# Patient Record
Sex: Female | Born: 1995 | Race: White | Hispanic: No | Marital: Single | Smoking: Never smoker
Health system: Southern US, Community
[De-identification: ages and names within clinical notes are randomized; demographics above are authoritative.]

## PROBLEM LIST (undated history)

## (undated) DIAGNOSIS — F4310 Post-traumatic stress disorder, unspecified: Secondary | ICD-10-CM

## (undated) DIAGNOSIS — F32A Depression, unspecified: Secondary | ICD-10-CM

## (undated) DIAGNOSIS — J40 Bronchitis, not specified as acute or chronic: Secondary | ICD-10-CM

## (undated) DIAGNOSIS — F419 Anxiety disorder, unspecified: Secondary | ICD-10-CM

## (undated) DIAGNOSIS — E282 Polycystic ovarian syndrome: Secondary | ICD-10-CM

## (undated) DIAGNOSIS — F329 Major depressive disorder, single episode, unspecified: Secondary | ICD-10-CM

---

## 2013-09-25 ENCOUNTER — Emergency Department (HOSPITAL_COMMUNITY)
Admission: EM | Admit: 2013-09-25 | Discharge: 2013-09-25 | Disposition: A | Discharge status: Home / Self Care | Payer: BC Managed Care – PPO | Attending: Emergency Medicine | Admitting: Emergency Medicine

## 2013-09-25 ENCOUNTER — Encounter (HOSPITAL_COMMUNITY): Payer: Self-pay | Admitting: Emergency Medicine

## 2013-09-25 ENCOUNTER — Emergency Department (HOSPITAL_COMMUNITY): Payer: BC Managed Care – PPO

## 2013-09-25 DIAGNOSIS — R197 Diarrhea, unspecified: Secondary | ICD-10-CM | POA: Insufficient documentation

## 2013-09-25 DIAGNOSIS — R059 Cough, unspecified: Secondary | ICD-10-CM

## 2013-09-25 DIAGNOSIS — F411 Generalized anxiety disorder: Secondary | ICD-10-CM | POA: Insufficient documentation

## 2013-09-25 DIAGNOSIS — F329 Major depressive disorder, single episode, unspecified: Secondary | ICD-10-CM | POA: Insufficient documentation

## 2013-09-25 DIAGNOSIS — F3289 Other specified depressive episodes: Secondary | ICD-10-CM | POA: Insufficient documentation

## 2013-09-25 DIAGNOSIS — F4310 Post-traumatic stress disorder, unspecified: Secondary | ICD-10-CM | POA: Insufficient documentation

## 2013-09-25 DIAGNOSIS — J069 Acute upper respiratory infection, unspecified: Secondary | ICD-10-CM | POA: Insufficient documentation

## 2013-09-25 DIAGNOSIS — R05 Cough: Secondary | ICD-10-CM

## 2013-09-25 DIAGNOSIS — Z8639 Personal history of other endocrine, nutritional and metabolic disease: Secondary | ICD-10-CM | POA: Insufficient documentation

## 2013-09-25 DIAGNOSIS — Z862 Personal history of diseases of the blood and blood-forming organs and certain disorders involving the immune mechanism: Secondary | ICD-10-CM | POA: Insufficient documentation

## 2013-09-25 DIAGNOSIS — R1110 Vomiting, unspecified: Secondary | ICD-10-CM | POA: Insufficient documentation

## 2013-09-25 DIAGNOSIS — Z79899 Other long term (current) drug therapy: Secondary | ICD-10-CM | POA: Insufficient documentation

## 2013-09-25 HISTORY — DX: Anxiety disorder, unspecified: F41.9

## 2013-09-25 HISTORY — DX: Post-traumatic stress disorder, unspecified: F43.10

## 2013-09-25 HISTORY — DX: Polycystic ovarian syndrome: E28.2

## 2013-09-25 HISTORY — DX: Bronchitis, not specified as acute or chronic: J40

## 2013-09-25 HISTORY — DX: Major depressive disorder, single episode, unspecified: F32.9

## 2013-09-25 HISTORY — DX: Depression, unspecified: F32.A

## 2013-09-25 MED ORDER — SALINE SPRAY 0.65 % NA SOLN: 1.0000 | NASAL | Status: DC | PRN

## 2013-09-25 MED ORDER — GUAIFENESIN 100 MG/5ML PO LIQD: 100.0000 mg | ORAL | Status: DC | PRN

## 2013-09-25 MED ORDER — BENZONATATE 100 MG PO CAPS: 100.0000 mg | ORAL_CAPSULE | Freq: Three times a day (TID) | ORAL | Status: DC

## 2013-09-25 NOTE — ED Provider Notes (Signed)
CSN: [id number]     Arrival date & time 09/25/13  2152 History  This chart was scribed for Junius FinnerErin O'Malley, PA working with Merrie RoofJohn David Wofford III, MD by Quintella ReichertMatthew Underwood, ED Scribe. This patient was seen in room WTR7/WTR7 and the patient's care was started at 11:22 PM.   Chief Complaint  Patient presents with  . Cough    The history is provided by the patient. No language interpreter was used.    HPI Comments: Amber Hurley is a 18 y.o. female who presents to the Emergency Department complaining of 3 days of persistent cough with associated congestion.  Pt states that today her cough worsened to the point that she has developed associated back pain, headache, and mild SOB.  She also had some post-tussive emesis today.  She denies any other emesis.  She does note 2 episodes of diarrhea today.  She denies fever.  She has taken OTC medication including Tylenol and Motrin, without relief.  Pt admits to recent sick contact with her mother who had similar symptoms.  She denies h/o asthma.  She denies medication allergies.   Past Medical History  Diagnosis Date  . Bronchitis   . Depression   . Anxiety   . PCOS (polycystic ovarian syndrome)   . PTSD (post-traumatic stress disorder)     History reviewed. No pertinent past surgical history.  No family history on file.   History  Substance Use Topics  . Smoking status: Never Smoker   . Smokeless tobacco: Not on file  . Alcohol Use: No    OB History   Grav Para Term Preterm Abortions TAB SAB Ect Mult Living                   Review of Systems  Constitutional: Negative for fever.  HENT: Positive for congestion.   Respiratory: Positive for cough and shortness of breath.   Gastrointestinal: Positive for vomiting (post-tussive) and diarrhea.  Musculoskeletal: Positive for back pain.  Neurological: Positive for headaches.  All other systems reviewed and are negative.      Allergies  Review of patient's allergies indicates no  known allergies.  Home Medications   Current Outpatient Rx  Name  Route  Sig  Dispense  Refill  . Norgestimate-Eth Estradiol (SPRINTEC 28 PO)   Oral   Take 1 tablet by mouth daily.         Marland Kitchen. PARoxetine (PAXIL) 10 MG tablet   Oral   Take 10 mg by mouth daily.         . benzonatate (TESSALON) 100 MG capsule   Oral   Take 1 capsule (100 mg total) by mouth every 8 (eight) hours.   21 capsule   0   . guaiFENesin (ROBITUSSIN) 100 MG/5ML liquid   Oral   Take 5-10 mLs (100-200 mg total) by mouth every 4 (four) hours as needed for cough.   60 mL   0   . sodium chloride (OCEAN) 0.65 % SOLN nasal spray   Each Nare   Place 1 spray into both nostrils as needed for congestion.   1 Bottle   0    BP 129/91  Pulse 105  Temp(Src) 99.8 F (37.7 C) (Oral)  Resp 16  LMP 09/01/2013  Physical Exam  Nursing note and vitals reviewed. Constitutional: She is oriented to person, place, and time. She appears well-developed and well-nourished.  HENT:  Head: Normocephalic and atraumatic.  Eyes: EOM are normal.  Neck: Normal range of motion.  Cardiovascular: Normal rate, regular rhythm and normal heart sounds.   No murmur heard. Pulmonary/Chest: Effort normal and breath sounds normal. No respiratory distress. She has no decreased breath sounds. She has no wheezes. She has no rhonchi. She has no rales.  Musculoskeletal: Normal range of motion.  Neurological: She is alert and oriented to person, place, and time.  Skin: Skin is warm and dry.  Psychiatric: She has a normal mood and affect. Her behavior is normal.    ED Course  Procedures (including critical care time)   COORDINATION OF CARE: 11:26 PM-Informed pt that CXR was normal and symptoms are likely due to self-limiting viral infection.  Discussed treatment plan of symptomatic relief including cough syrup with pt at bedside and pt agreed to plan.     Labs Review Labs Reviewed - No data to display  Imaging Review Dg Chest 2  View  09/25/2013   CLINICAL DATA:  Shortness of breath and cough.  EXAM: CHEST  2 VIEW  COMPARISON:  PA and lateral chest 11/29/2007.  FINDINGS: Lungs are clear. Heart size is normal. No pneumothorax or pleural effusion. Scoliosis noted.  IMPRESSION: No acute disease.   Electronically Signed   By: Drusilla Kanner M.D.   On: 09/25/2013 22:56     EKG Interpretation None      MDM   Final diagnoses:  Cough  URI (upper respiratory infection)    Pt is a 18yo female with no significant PMH presenting with URI symptosm x3days.  Vitals: unremarkable  CXR: no acute disease. Pt appears well, non-toxic. No respiratory distress. Will tx symptomatically. Return precautions provided. Pt verbalized understanding and agreement with tx plan.  I personally performed the services described in this documentation, which was scribed in my presence. The recorded information has been reviewed and is accurate.    Junius Finner, PA-C 09/26/13 [phone]

## 2013-09-25 NOTE — Discharge Instructions (Signed)
Cough, Adult ° A cough is a reflex. It helps you clear your throat and airways. A cough can help heal your body. A cough can last 2 or 3 weeks (acute) or may last more than 8 weeks (chronic). Some common causes of a cough can include an infection, allergy, or a cold. °HOME CARE °· Only take medicine as told by your doctor. °· If given, take your medicines (antibiotics) as told. Finish them even if you start to feel better. °· Use a cold steam vaporizer or humidier in your home. This can help loosen thick spit (secretions). °· Sleep so you are almost sitting up (semi-upright). Use pillows to do this. This helps reduce coughing. °· Rest as needed. °· Stop smoking if you smoke. °GET HELP RIGHT AWAY IF: °· You have yellowish-white fluid (pus) in your thick spit. °· Your cough gets worse. °· Your medicine does not reduce coughing, and you are losing sleep. °· You cough up blood. °· You have trouble breathing. °· Your pain gets worse and medicine does not help. °· You have a fever. °MAKE SURE YOU:  °· Understand these instructions. °· Will watch your condition. °· Will get help right away if you are not doing well or get worse. °Document Released: 03/09/2011 Document Revised: 09/18/2011 Document Reviewed: 03/09/2011 °ExitCare® Patient Information ©2014 [employer]. ° °Cool Mist Vaporizers °Vaporizers may help relieve the symptoms of a cough and cold. They add moisture to the air, which helps mucus to become thinner and less sticky. This makes it easier to breathe and cough up secretions. Cool mist vaporizers do not cause serious burns like hot mist vaporizers ("steamers, humidifiers"). Vaporizers have not been proved to show they help with colds. You should not use a vaporizer if you are allergic to mold.  °HOME CARE INSTRUCTIONS °· Follow the package instructions for the vaporizer. °· Do not use anything other than distilled water in the vaporizer. °· Do not run the vaporizer all of the time. This can cause mold or  bacteria to grow in the vaporizer. °· Clean the vaporizer after each time it is used. °· Clean and dry the vaporizer well before storing it. °· Stop using the vaporizer if worsening respiratory symptoms develop. °Document Released: 03/23/2004 Document Revised: 02/26/2013 Document Reviewed: 11/13/2012 °ExitCare® Patient Information ©2014 [employer]. ° °

## 2013-09-25 NOTE — ED Notes (Signed)
Pt reports cough and congestion x 3 days; increasing cough today; pt states that she vomited from coughing earlier; pt reports decreased appetite; pt c/o gagging with cough currently; pt c/o feeling short of breath

## 2013-09-26 NOTE — ED Provider Notes (Signed)
Medical screening examination/treatment/procedure(s) were performed by non-physician practitioner and as supervising physician I was immediately available for consultation/collaboration.   EKG Interpretation None        Candyce ChurnJohn David Chilton Sallade III, MD 09/26/13 1239

## 2014-09-09 ENCOUNTER — Encounter (HOSPITAL_COMMUNITY): Payer: Self-pay

## 2014-09-09 ENCOUNTER — Emergency Department (HOSPITAL_COMMUNITY)
Admission: EM | Admit: 2014-09-09 | Discharge: 2014-09-10 | Disposition: A | Discharge status: Home / Self Care | Payer: BLUE CROSS/BLUE SHIELD | Attending: Emergency Medicine | Admitting: Emergency Medicine

## 2014-09-09 ENCOUNTER — Emergency Department (HOSPITAL_COMMUNITY): Payer: BLUE CROSS/BLUE SHIELD

## 2014-09-09 DIAGNOSIS — R1011 Right upper quadrant pain: Secondary | ICD-10-CM | POA: Insufficient documentation

## 2014-09-09 DIAGNOSIS — Z8659 Personal history of other mental and behavioral disorders: Secondary | ICD-10-CM | POA: Diagnosis not present

## 2014-09-09 DIAGNOSIS — R102 Pelvic and perineal pain: Secondary | ICD-10-CM

## 2014-09-09 DIAGNOSIS — O9989 Other specified diseases and conditions complicating pregnancy, childbirth and the puerperium: Secondary | ICD-10-CM | POA: Insufficient documentation

## 2014-09-09 DIAGNOSIS — Z8639 Personal history of other endocrine, nutritional and metabolic disease: Secondary | ICD-10-CM | POA: Insufficient documentation

## 2014-09-09 DIAGNOSIS — O26891 Other specified pregnancy related conditions, first trimester: Secondary | ICD-10-CM

## 2014-09-09 DIAGNOSIS — Z3A01 Less than 8 weeks gestation of pregnancy: Secondary | ICD-10-CM | POA: Insufficient documentation

## 2014-09-09 DIAGNOSIS — R1031 Right lower quadrant pain: Secondary | ICD-10-CM | POA: Diagnosis not present

## 2014-09-09 DIAGNOSIS — Z8709 Personal history of other diseases of the respiratory system: Secondary | ICD-10-CM | POA: Diagnosis not present

## 2014-09-09 DIAGNOSIS — Z79899 Other long term (current) drug therapy: Secondary | ICD-10-CM | POA: Diagnosis not present

## 2014-09-09 DIAGNOSIS — R1010 Upper abdominal pain, unspecified: Secondary | ICD-10-CM

## 2014-09-09 LAB — URINALYSIS, ROUTINE W REFLEX MICROSCOPIC
Bilirubin Urine: NEGATIVE
GLUCOSE, UA: NEGATIVE mg/dL
Hgb urine dipstick: NEGATIVE
KETONES UR: NEGATIVE mg/dL
LEUKOCYTES UA: NEGATIVE
NITRITE: NEGATIVE
PH: 5 (ref 5.0–8.0)
Protein, ur: NEGATIVE mg/dL
SPECIFIC GRAVITY, URINE: 1.019 (ref 1.005–1.030)
Urobilinogen, UA: 0.2 mg/dL (ref 0.0–1.0)

## 2014-09-09 LAB — CBC WITH DIFFERENTIAL/PLATELET
BASOS ABS: 0 10*3/uL (ref 0.0–0.1)
BASOS PCT: 0 % (ref 0–1)
EOS ABS: 0.2 10*3/uL (ref 0.0–0.7)
Eosinophils Relative: 2 % (ref 0–5)
HCT: 38.7 % (ref 36.0–46.0)
Hemoglobin: 12.6 g/dL (ref 12.0–15.0)
LYMPHS PCT: 38 % (ref 12–46)
Lymphs Abs: 3.7 10*3/uL (ref 0.7–4.0)
MCH: 27 pg (ref 26.0–34.0)
MCHC: 32.6 g/dL (ref 30.0–36.0)
MCV: 83 fL (ref 78.0–100.0)
Monocytes Absolute: 1 10*3/uL (ref 0.1–1.0)
Monocytes Relative: 10 % (ref 3–12)
Neutro Abs: 4.9 10*3/uL (ref 1.7–7.7)
Neutrophils Relative %: 50 % (ref 43–77)
Platelets: 303 10*3/uL (ref 150–400)
RBC: 4.66 MIL/uL (ref 3.87–5.11)
RDW: 15.1 % (ref 11.5–15.5)
WBC: 9.8 10*3/uL (ref 4.0–10.5)

## 2014-09-09 LAB — BASIC METABOLIC PANEL
Anion gap: 7 (ref 5–15)
BUN: 10 mg/dL (ref 6–23)
CHLORIDE: 104 mmol/L (ref 96–112)
CO2: 25 mmol/L (ref 19–32)
Calcium: 9.6 mg/dL (ref 8.4–10.5)
Creatinine, Ser: 0.43 mg/dL — ABNORMAL LOW (ref 0.50–1.10)
GFR calc Af Amer: >90 mL/min (ref >90)
GFR calc non Af Amer: >90 mL/min (ref >90)
GLUCOSE: 97 mg/dL (ref 70–99)
POTASSIUM: 3.5 mmol/L (ref 3.5–5.1)
Sodium: 136 mmol/L (ref 135–145)

## 2014-09-09 LAB — HEPATIC FUNCTION PANEL
ALT: 53 U/L — ABNORMAL HIGH (ref 0–35)
AST: 29 U/L (ref 0–37)
Albumin: 4 g/dL (ref 3.5–5.2)
Alkaline Phosphatase: 70 U/L (ref 39–117)
Bilirubin, Direct: <0.1 mg/dL (ref 0.0–0.5)
Total Bilirubin: 0.1 mg/dL — ABNORMAL LOW (ref 0.3–1.2)
Total Protein: 7.8 g/dL (ref 6.0–8.3)

## 2014-09-09 LAB — WET PREP, GENITAL
Clue Cells Wet Prep HPF POC: NONE SEEN
Trich, Wet Prep: NONE SEEN
Yeast Wet Prep HPF POC: NONE SEEN

## 2014-09-09 LAB — HCG, QUANTITATIVE, PREGNANCY: hCG, Beta Chain, Quant, S: 1110 m[IU]/mL — ABNORMAL HIGH (ref <5)

## 2014-09-09 LAB — LIPASE, BLOOD: Lipase: 22 U/L (ref 11–59)

## 2014-09-09 NOTE — Progress Notes (Signed)
EDCM spoke to patient at bedside. Patient reports [place]  Patient confirms she has [employer] insurance, here for school.  [facility] informed [facility] that she may call the phone number on the back of her insurance card or go to insurance company website to help her find a pcp who is close to her school and within network.  Patient verbalized understanding.  No further EDCM needs at this time.

## 2014-09-09 NOTE — ED Notes (Addendum)
Pt is [redacted] weeks pregnant. C/O pain in abdomen and back.  No spotting.  Primary care confirmed pregnancy.  Nausea with vomiting x 2.

## 2014-09-09 NOTE — ED Provider Notes (Signed)
CSN: [id number]     Arrival date & time 09/09/14  1835 History   First MD Initiated Contact with Patient 09/09/14 2013     Chief Complaint  Patient presents with  . Abdominal Pain  . Back Pain    HPI Comments: 4 G1P0A0 female presents with abdominal pain/cramping. She reports her LMP was on Jan 17th making her 6 weeks 6 days. Pt reports occasion headaches and nausea/vomitting at night. This morning she reports havinig sharp pain to her abdominal and back. Worse with palpation, not increased with food or drink. Reports its diffuse and worse in the lower abdomen. She reports odorous white discharge since pregnancy, denies history of STI's. Reports increased frequency of urination but denies change in color, odor, or painful voiding. Denies vaginal bleeding.   Denies smoking, alcohol, drug use. No surgical history. Only taking prenatal vitamins.     Past Medical History  Diagnosis Date  . Bronchitis   . Depression   . Anxiety   . PCOS (polycystic ovarian syndrome)   . PTSD (post-traumatic stress disorder)    History reviewed. No pertinent past surgical history. History reviewed. No pertinent family history. History  Substance Use Topics  . Smoking status: Never Smoker   . Smokeless tobacco: Not on file  . Alcohol Use: No   OB History    No data available     Review of Systems  All other systems reviewed and are negative.   Allergies  Review of patient's allergies indicates no known allergies.  Home Medications   Prior to Admission medications   Medication Sig Start Date End Date Taking? Authorizing Provider  Prenatal Vit-Fe Fumarate-FA (MULTIVITAMIN-PRENATAL) 27-0.8 MG TABS tablet Take 1 tablet by mouth daily at 12 noon.   Yes Historical Provider, MD  benzonatate (TESSALON) 100 MG capsule Take 1 capsule (100 mg total) by mouth every 8 (eight) hours. Patient not taking: Reported on 09/09/2014 09/25/13   Junius Finner, PA-C  guaiFENesin (ROBITUSSIN) 100 MG/5ML liquid Take 5-10  mLs (100-200 mg total) by mouth every 4 (four) hours as needed for cough. Patient not taking: Reported on 09/09/2014 09/25/13   Junius Finner, PA-C  sodium chloride (OCEAN) 0.65 % SOLN nasal spray Place 1 spray into both nostrils as needed for congestion. Patient not taking: Reported on 09/09/2014 09/25/13   Junius Finner, PA-C   BP 140/94 mmHg  Pulse 94  Temp(Src) 98.6 F (37 C) (Oral)  Resp 16  SpO2 95%  LMP 07/27/2014 Physical Exam  Constitutional: She is oriented to person, place, and time. She appears well-developed and well-nourished.  HENT:  Head: Normocephalic and atraumatic.  Eyes: Pupils are equal, round, and reactive to light.  Neck: Normal range of motion. Neck supple. No JVD present. No tracheal deviation present. No thyromegaly present.  Cardiovascular: Regular rhythm, normal heart sounds and intact distal pulses.  Exam reveals no gallop and no friction rub.   No murmur heard. Pulmonary/Chest: Effort normal and breath sounds normal. No stridor. No respiratory distress. She has no wheezes. She has no rales. She exhibits no tenderness.  Abdominal: Soft. Bowel sounds are normal. She exhibits no distension and no mass. There is tenderness. There is no rebound and no guarding.  RUQ tenderness, right lower quadrant tenderness  Genitourinary: Vagina normal. Pelvic exam was performed with patient supine. There is no rash, tenderness, lesion or injury on the right labia. There is no rash, tenderness, lesion or injury on the left labia. Uterus is not deviated, not enlarged, not fixed and  not tender. Cervix exhibits no motion tenderness, no discharge and no friability. Right adnexum displays no mass, no tenderness and no fullness. Left adnexum displays no mass, no tenderness and no fullness. No bleeding in the vagina.  Musculoskeletal: Normal range of motion.  Lymphadenopathy:    She has no cervical adenopathy.  Neurological: She is alert and oriented to person, place, and time. Coordination  normal.  Skin: Skin is warm and dry.  Psychiatric: She has a normal mood and affect. Her behavior is normal. Judgment and thought content normal.  Nursing note and vitals reviewed.   ED Course  Procedures (including critical care time) Labs Review Labs Reviewed  BASIC METABOLIC PANEL - Abnormal; Notable for the following:    Creatinine, Ser 0.43 (*)    All other components within normal limits  HEPATIC FUNCTION PANEL - Abnormal; Notable for the following:    ALT 53 (*)    Total Bilirubin 0.1 (*)    All other components within normal limits  CBC WITH DIFFERENTIAL/PLATELET  URINALYSIS, ROUTINE W REFLEX MICROSCOPIC  LIPASE, BLOOD  HCG, QUANTITATIVE, PREGNANCY    Imaging Review No results found.   EKG Interpretation None     MDM   Final diagnoses:  None   Pt presented with abdominal pain  - confirmed pregnancy with hCG of 1100 - No signs of STI on physical or lab results- still pending G/C HIV RPR - Hepatic function normal with only slightly elevated ALT, normal AST - Lipase normal - urinalysis normal - CBC normal - BMP normal with only slightly low creatinine .9243  - US showed early intrauterine gestation, estimated age 99 weeks 2 days. Embryo not yet visible.   Pt reports improvement of her abdominal pain over the course of her stay. She was discharged home with instructions to follow-up with her OBGYN for further management. She was in agreement with this plan.   Kelle DartingJeffrey Todd Tayli Buch, PA-C 09/10/14 [id number]  Flint MelterElliott L Wentz, MD 09/10/14 1556

## 2014-09-10 LAB — GC/CHLAMYDIA PROBE AMP (~~LOC~~) NOT AT ARMC
Chlamydia: NEGATIVE
Neisseria Gonorrhea: NEGATIVE

## 2014-09-10 LAB — RPR: RPR Ser Ql: NONREACTIVE

## 2014-09-10 LAB — HIV ANTIBODY (ROUTINE TESTING W REFLEX): HIV Screen 4th Generation wRfx: NONREACTIVE

## 2014-09-10 NOTE — ED Provider Notes (Signed)
  Face-to-face evaluation   History: She is here for evaluation of  Upper abdominal pain. Pain started today. She has had nausea and 2 episodes of vomiting recently. She's not had similar pain in the past.  Physical exam: Obese, alert, cooperative. Heart regular in rhythm. No murmur. Lungs clear to auscultation. Abdomen- soft, mild left and right upper quadrant abdominal tenderness. Mild suprapubic tenderness to light palpation.  Medical screening examination/treatment/procedure(s) were conducted as a shared visit with non-physician practitioner(s) and myself.  I personally evaluated the patient during the encounter  Flint MelterElliott L Ellis Koffler, MD 09/10/14 1556

## 2014-09-10 NOTE — Discharge Instructions (Signed)

## 2014-12-14 ENCOUNTER — Encounter (HOSPITAL_COMMUNITY): Payer: Self-pay

## 2014-12-14 ENCOUNTER — Emergency Department (HOSPITAL_COMMUNITY)
Admission: EM | Admit: 2014-12-14 | Discharge: 2014-12-15 | Disposition: A | Discharge status: Home / Self Care | Payer: BLUE CROSS/BLUE SHIELD | Attending: Emergency Medicine | Admitting: Emergency Medicine

## 2014-12-14 DIAGNOSIS — Z8659 Personal history of other mental and behavioral disorders: Secondary | ICD-10-CM | POA: Diagnosis not present

## 2014-12-14 DIAGNOSIS — O9989 Other specified diseases and conditions complicating pregnancy, childbirth and the puerperium: Secondary | ICD-10-CM | POA: Insufficient documentation

## 2014-12-14 DIAGNOSIS — Z8639 Personal history of other endocrine, nutritional and metabolic disease: Secondary | ICD-10-CM | POA: Insufficient documentation

## 2014-12-14 DIAGNOSIS — Z3A01 Less than 8 weeks gestation of pregnancy: Secondary | ICD-10-CM | POA: Diagnosis not present

## 2014-12-14 DIAGNOSIS — Z8709 Personal history of other diseases of the respiratory system: Secondary | ICD-10-CM | POA: Insufficient documentation

## 2014-12-14 DIAGNOSIS — R1030 Lower abdominal pain, unspecified: Secondary | ICD-10-CM | POA: Diagnosis not present

## 2014-12-14 DIAGNOSIS — O210 Mild hyperemesis gravidarum: Secondary | ICD-10-CM | POA: Insufficient documentation

## 2014-12-14 DIAGNOSIS — R1013 Epigastric pain: Secondary | ICD-10-CM | POA: Diagnosis not present

## 2014-12-14 DIAGNOSIS — Z79899 Other long term (current) drug therapy: Secondary | ICD-10-CM | POA: Diagnosis not present

## 2014-12-14 DIAGNOSIS — O219 Vomiting of pregnancy, unspecified: Secondary | ICD-10-CM

## 2014-12-14 LAB — CBC
HEMATOCRIT: 37.2 % (ref 36.0–46.0)
HEMOGLOBIN: 12.3 g/dL (ref 12.0–15.0)
MCH: 26.9 pg (ref 26.0–34.0)
MCHC: 33.1 g/dL (ref 30.0–36.0)
MCV: 81.2 fL (ref 78.0–100.0)
Platelets: 267 10*3/uL (ref 150–400)
RBC: 4.58 MIL/uL (ref 3.87–5.11)
RDW: 13.4 % (ref 11.5–15.5)
WBC: 9.1 10*3/uL (ref 4.0–10.5)

## 2014-12-14 LAB — BASIC METABOLIC PANEL
ANION GAP: 9 (ref 5–15)
BUN: 7 mg/dL (ref 6–20)
CO2: 24 mmol/L (ref 22–32)
Calcium: 9.3 mg/dL (ref 8.9–10.3)
Chloride: 104 mmol/L (ref 101–111)
Creatinine, Ser: 0.51 mg/dL (ref 0.44–1.00)
GFR calc Af Amer: >60 mL/min (ref >60)
GFR calc non Af Amer: >60 mL/min (ref >60)
Glucose, Bld: 82 mg/dL (ref 65–99)
Potassium: 3.5 mmol/L (ref 3.5–5.1)
Sodium: 137 mmol/L (ref 135–145)

## 2014-12-14 LAB — POC URINE PREG, ED: Preg Test, Ur: POSITIVE — AB

## 2014-12-14 MED ORDER — SODIUM CHLORIDE 0.9 % IV BOLUS (SEPSIS)
1000.0000 mL | Freq: Once | INTRAVENOUS | Status: AC
  Administered 2014-12-15: 1000 mL via INTRAVENOUS

## 2014-12-14 MED ORDER — METOCLOPRAMIDE HCL 5 MG/ML IJ SOLN
10.0000 mg | Freq: Once | INTRAMUSCULAR | Status: AC
  Administered 2014-12-15: 10 mg via INTRAVENOUS
  Filled 2014-12-14: qty 2

## 2014-12-14 NOTE — ED Notes (Signed)
Pt presents with c/o chest pressure in the center of her chest that started this morning. Pt reports she is approx [redacted] weeks pregnant at this time and has also been consistently vomiting and unable to keep anything down.

## 2014-12-14 NOTE — ED Provider Notes (Signed)
CSN: [id number]     Arrival date & time 12/14/14  1957 History   First MD Initiated Contact with Patient 12/14/14 2348     Chief Complaint  Patient presents with  . Vomiting      (Consider location/radiation/quality/duration/timing/severity/associated sxs/prior Treatment) HPI This is a 19 year old female who is about [redacted] weeks pregnant. She is here with a two-week history of nausea and vomiting. She states that anytime she eats or drinks something she vomits and symptoms of worsened over the past 2 days. She's been taking Zofran as prescribed by her PCP without relief. She is also complaining of moderate epigastric pain and mild suprapubic pain. She denies vaginal bleeding or discharge. She is also having a vaguely described discomfort in her chest. She is not short of breath.  Past Medical History  Diagnosis Date  . Bronchitis   . Depression   . Anxiety   . PCOS (polycystic ovarian syndrome)   . PTSD (post-traumatic stress disorder)    History reviewed. No pertinent past surgical history. No family history on file. History  Substance Use Topics  . Smoking status: Never Smoker   . Smokeless tobacco: Not on file  . Alcohol Use: No   OB History    No data available     Review of Systems  All other systems reviewed and are negative.   Allergies  Review of patient's allergies indicates no known allergies.  Home Medications   Prior to Admission medications   Medication Sig Start Date End Date Taking? Authorizing Provider  ondansetron (ZOFRAN-ODT) 8 MG disintegrating tablet Take 1 tablet by mouth every 8 (eight) hours as needed for nausea or vomiting.  12/10/14  Yes Historical Provider, MD  Prenatal Vit-Fe Fumarate-FA (MULTIVITAMIN-PRENATAL) 27-0.8 MG TABS tablet Take 1 tablet by mouth daily at 12 noon.   Yes Historical Provider, MD  benzonatate (TESSALON) 100 MG capsule Take 1 capsule (100 mg total) by mouth every 8 (eight) hours. Patient not taking: Reported on 09/09/2014 09/25/13    Junius FinnerErin O'Malley, PA-C  guaiFENesin (ROBITUSSIN) 100 MG/5ML liquid Take 5-10 mLs (100-200 mg total) by mouth every 4 (four) hours as needed for cough. Patient not taking: Reported on 09/09/2014 09/25/13   Junius FinnerErin O'Malley, PA-C  sodium chloride (OCEAN) 0.65 % SOLN nasal spray Place 1 spray into both nostrils as needed for congestion. Patient not taking: Reported on 09/09/2014 09/25/13   Junius FinnerErin O'Malley, PA-C   BP 142/94 mmHg  Pulse 73  Temp(Src) 98.1 F (36.7 C) (Oral)  Resp 20  SpO2 100%  LMP 10/29/2014 (Exact Date)   Physical Exam  General: Well-developed, well-nourished female in no acute distress; appearance consistent with age of record HENT: normocephalic; atraumatic Eyes: pupils equal, round and reactive to light; extraocular muscles intact Neck: supple Heart: regular rate and rhythm Lungs: clear to auscultation bilaterally Abdomen: soft; nondistended; mild suprapubic tenderness and mild to moderate epigastric tenderness; no masses or hepatosplenomegaly; bowel sounds present Extremities: No deformity; full range of motion; pulses normal Neurologic: Awake, alert and oriented; motor function intact in all extremities and symmetric; no facial droop Skin: Warm and dry Psychiatric: Normal mood and affect    ED Course  Procedures (including critical care time)   MDM   Nursing notes and vitals signs, including pulse oximetry, reviewed.  Summary of this visit's results, reviewed by myself:  Labs:  Results for orders placed or performed during the hospital encounter of 12/14/14 (from the past 24 hour(s))  CBC     Status: None  Collection Time: 12/14/14  8:37 PM  Result Value Ref Range   WBC 9.1 4.0 - 10.5 K/uL   RBC 4.58 3.87 - 5.11 MIL/uL   Hemoglobin 12.3 12.0 - 15.0 g/dL   HCT 16.1 09.6 - 04.5 %   MCV 81.2 78.0 - 100.0 fL   MCH 26.9 26.0 - 34.0 pg   MCHC 33.1 30.0 - 36.0 g/dL   RDW 40.9 81.1 - 91.4 %   Platelets 267 150 - 400 K/uL  Basic metabolic panel     Status: None    Collection Time: 12/14/14  8:37 PM  Result Value Ref Range   Sodium 137 135 - 145 mmol/L   Potassium 3.5 3.5 - 5.1 mmol/L   Chloride 104 101 - 111 mmol/L   CO2 24 22 - 32 mmol/L   Glucose, Bld 82 65 - 99 mg/dL   BUN 7 6 - 20 mg/dL   Creatinine, Ser 7.82 0.44 - 1.00 mg/dL   Calcium 9.3 8.9 - 95.6 mg/dL   GFR calc non Af Amer >60 >60 mL/min   GFR calc Af Amer >60 >60 mL/min   Anion gap 9 5 - 15  POC Urine Pregnancy, ED (pre-menopausal females)  not at [facility]     Status: Abnormal   Collection Time: 12/14/14 11:47 PM  Result Value Ref Range   Preg Test, Ur POSITIVE (A) NEGATIVE  Urinalysis, Routine w reflex microscopic (not at [facility])     Status: Abnormal   Collection Time: 12/14/14 11:53 PM  Result Value Ref Range   Color, Urine YELLOW YELLOW   APPearance CLOUDY (A) CLEAR   Specific Gravity, Urine 1.027 1.005 - 1.030   pH 5.5 5.0 - 8.0   Glucose, UA NEGATIVE NEGATIVE mg/dL   Hgb urine dipstick NEGATIVE NEGATIVE   Bilirubin Urine NEGATIVE NEGATIVE   Ketones, ur NEGATIVE NEGATIVE mg/dL   Protein, ur NEGATIVE NEGATIVE mg/dL   Urobilinogen, UA 1.0 0.0 - 1.0 mg/dL   Nitrite NEGATIVE NEGATIVE   Leukocytes, UA SMALL (A) NEGATIVE  Urine microscopic-add on     Status: Abnormal   Collection Time: 12/14/14 11:53 PM  Result Value Ref Range   Squamous Epithelial / LPF MANY (A) RARE   WBC, UA 0-2 <3 WBC/hpf   Bacteria, UA FEW (A) RARE   Urine-Other MUCOUS PRESENT    1:30 AM Patient feeling better after IV fluids and Reglan. Has been drinking fluids without emesis.  Paula Libra, MD 12/15/14 0130

## 2014-12-14 NOTE — ED Notes (Signed)
Pt states she is around [redacted] weeks pregnant, found out d/t the nausea and morning sickness, states has also felt a "weirdness" in her chest, pt is in room eating chips and drinking apple juice, in no distress.

## 2014-12-15 DIAGNOSIS — O210 Mild hyperemesis gravidarum: Secondary | ICD-10-CM | POA: Diagnosis not present

## 2014-12-15 LAB — URINALYSIS, ROUTINE W REFLEX MICROSCOPIC
Bilirubin Urine: NEGATIVE
GLUCOSE, UA: NEGATIVE mg/dL
Hgb urine dipstick: NEGATIVE
Ketones, ur: NEGATIVE mg/dL
Nitrite: NEGATIVE
PROTEIN: NEGATIVE mg/dL
Specific Gravity, Urine: 1.027 (ref 1.005–1.030)
Urobilinogen, UA: 1 mg/dL (ref 0.0–1.0)
pH: 5.5 (ref 5.0–8.0)

## 2014-12-15 LAB — URINE MICROSCOPIC-ADD ON

## 2014-12-15 MED ORDER — METOCLOPRAMIDE HCL 10 MG PO TABS: 10.0000 mg | ORAL_TABLET | Freq: Four times a day (QID) | ORAL | Status: AC | PRN

## 2015-01-15 ENCOUNTER — Encounter (HOSPITAL_COMMUNITY): Payer: Self-pay | Admitting: Emergency Medicine

## 2015-01-15 ENCOUNTER — Emergency Department (HOSPITAL_COMMUNITY)
Admission: EM | Admit: 2015-01-15 | Discharge: 2015-01-15 | Disposition: A | Discharge status: Home / Self Care | Payer: BLUE CROSS/BLUE SHIELD | Attending: Emergency Medicine | Admitting: Emergency Medicine

## 2015-01-15 DIAGNOSIS — Z79899 Other long term (current) drug therapy: Secondary | ICD-10-CM | POA: Insufficient documentation

## 2015-01-15 DIAGNOSIS — O9989 Other specified diseases and conditions complicating pregnancy, childbirth and the puerperium: Secondary | ICD-10-CM | POA: Diagnosis not present

## 2015-01-15 DIAGNOSIS — R1010 Upper abdominal pain, unspecified: Secondary | ICD-10-CM | POA: Insufficient documentation

## 2015-01-15 DIAGNOSIS — R42 Dizziness and giddiness: Secondary | ICD-10-CM | POA: Diagnosis not present

## 2015-01-15 DIAGNOSIS — Z3A11 11 weeks gestation of pregnancy: Secondary | ICD-10-CM | POA: Insufficient documentation

## 2015-01-15 DIAGNOSIS — Z8639 Personal history of other endocrine, nutritional and metabolic disease: Secondary | ICD-10-CM | POA: Diagnosis not present

## 2015-01-15 DIAGNOSIS — Z8659 Personal history of other mental and behavioral disorders: Secondary | ICD-10-CM | POA: Insufficient documentation

## 2015-01-15 DIAGNOSIS — O210 Mild hyperemesis gravidarum: Secondary | ICD-10-CM | POA: Diagnosis present

## 2015-01-15 LAB — URINE MICROSCOPIC-ADD ON

## 2015-01-15 LAB — URINALYSIS, ROUTINE W REFLEX MICROSCOPIC
BILIRUBIN URINE: NEGATIVE
Glucose, UA: NEGATIVE mg/dL
HGB URINE DIPSTICK: NEGATIVE
Nitrite: NEGATIVE
PROTEIN: NEGATIVE mg/dL
SPECIFIC GRAVITY, URINE: 1.029 (ref 1.005–1.030)
Urobilinogen, UA: 1 mg/dL (ref 0.0–1.0)
pH: 5.5 (ref 5.0–8.0)

## 2015-01-15 LAB — BASIC METABOLIC PANEL
Anion gap: 11 (ref 5–15)
BUN: 7 mg/dL (ref 6–20)
CO2: 24 mmol/L (ref 22–32)
CREATININE: 0.38 mg/dL — AB (ref 0.44–1.00)
Calcium: 9.6 mg/dL (ref 8.9–10.3)
Chloride: 102 mmol/L (ref 101–111)
GFR calc Af Amer: >60 mL/min (ref >60)
GFR calc non Af Amer: >60 mL/min (ref >60)
GLUCOSE: 86 mg/dL (ref 65–99)
Potassium: 3.4 mmol/L — ABNORMAL LOW (ref 3.5–5.1)
SODIUM: 137 mmol/L (ref 135–145)

## 2015-01-15 MED ORDER — ONDANSETRON HCL 4 MG/2ML IJ SOLN
4.0000 mg | Freq: Once | INTRAMUSCULAR | Status: AC
  Administered 2015-01-15: 4 mg via INTRAVENOUS
  Filled 2015-01-15: qty 2

## 2015-01-15 MED ORDER — ONDANSETRON 8 MG PO TBDP: 8.0000 mg | ORAL_TABLET | Freq: Three times a day (TID) | ORAL | Status: AC | PRN

## 2015-01-15 MED ORDER — SODIUM CHLORIDE 0.9 % IV BOLUS (SEPSIS)
1000.0000 mL | Freq: Once | INTRAVENOUS | Status: AC
  Administered 2015-01-15: 1000 mL via INTRAVENOUS

## 2015-01-15 NOTE — ED Provider Notes (Signed)
CSN: [id number]     Arrival date & time 01/15/15  2023 History   First MD Initiated Contact with Patient 01/15/15 2105     Chief Complaint  Patient presents with  . Emesis  . Nausea  . Emesis During Pregnancy     (Consider location/radiation/quality/duration/timing/severity/associated sxs/prior Treatment) Patient is a 19 y.o. female presenting with vomiting. The history is provided by the patient and the spouse. No language interpreter was used.  Emesis Severity:  Moderate Progression:  Unchanged Associated symptoms: no chills   Associated symptoms comment:  She has been vomiting for most of her pregnancy and is currently 11 weeks. She has been given Zofran, Reglan and Diclegis by her OB ([facility]) without resolution. No fever. She denies lower abdominal pain, vaginal bleeding or discharge.    Past Medical History  Diagnosis Date  . Bronchitis   . Depression   . Anxiety   . PCOS (polycystic ovarian syndrome)   . PTSD (post-traumatic stress disorder)    History reviewed. No pertinent past surgical history. History reviewed. No pertinent family history. History  Substance Use Topics  . Smoking status: Never Smoker   . Smokeless tobacco: Not on file  . Alcohol Use: No   OB History    Gravida Para Term Preterm AB TAB SAB Ectopic Multiple Living   1              Review of Systems  Constitutional: Negative for fever and chills.  Gastrointestinal: Positive for nausea and vomiting.       She has upper abdominal pain, worse with vomiting.  Genitourinary: Negative.   Musculoskeletal: Negative.   Skin: Negative.  Negative for color change.  Neurological: Positive for light-headedness. Negative for syncope.      Allergies  Review of patient's allergies indicates no known allergies.  Home Medications   Prior to Admission medications   Medication Sig Start Date End Date Taking? Authorizing Provider  metoCLOPramide (REGLAN) 10 MG tablet Take 1 tablet (10 mg total) by mouth  every 6 (six) hours as needed for nausea or vomiting. 12/15/14   Paula LibraJohn Molpus, MD  Prenatal Vit-Fe Fumarate-FA (MULTIVITAMIN-PRENATAL) 27-0.8 MG TABS tablet Take 1 tablet by mouth daily at 12 noon.    Historical Provider, MD   BP 142/89 mmHg  Pulse 101  Temp(Src) 97.6 F (36.4 C) (Oral)  Resp 18  SpO2 100%  LMP 10/29/2014 (Exact Date) Physical Exam  Constitutional: She is oriented to person, place, and time. She appears well-developed and well-nourished. No distress.  Actively vomiting.  Neck: Normal range of motion.  Pulmonary/Chest: Effort normal.  Neurological: She is alert and oriented to person, place, and time.  Skin: Skin is warm and dry.  Psychiatric: She has a normal mood and affect.    ED Course  Procedures (including critical care time) Labs Review Labs Reviewed  BASIC METABOLIC PANEL  URINALYSIS, ROUTINE W REFLEX MICROSCOPIC (NOT AT [facility])   Results for orders placed or performed during the hospital encounter of 01/15/15  Basic metabolic panel  Result Value Ref Range   Sodium 137 135 - 145 mmol/L   Potassium 3.4 (L) 3.5 - 5.1 mmol/L   Chloride 102 101 - 111 mmol/L   CO2 24 22 - 32 mmol/L   Glucose, Bld 86 65 - 99 mg/dL   BUN 7 6 - 20 mg/dL   Creatinine, Ser 4.090.38 (L) 0.44 - 1.00 mg/dL   Calcium 9.6 8.9 - 81.110.3 mg/dL   GFR calc non Af Amer >60 >60  mL/min   GFR calc Af Amer >60 >60 mL/min   Anion gap 11 5 - 15  Urinalysis, Routine w reflex microscopic (not at [facility])  Result Value Ref Range   Color, Urine YELLOW YELLOW   APPearance CLOUDY (A) CLEAR   Specific Gravity, Urine 1.029 1.005 - 1.030   pH 5.5 5.0 - 8.0   Glucose, UA NEGATIVE NEGATIVE mg/dL   Hgb urine dipstick NEGATIVE NEGATIVE   Bilirubin Urine NEGATIVE NEGATIVE   Ketones, ur >80 (A) NEGATIVE mg/dL   Protein, ur NEGATIVE NEGATIVE mg/dL   Urobilinogen, UA 1.0 0.0 - 1.0 mg/dL   Nitrite NEGATIVE NEGATIVE   Leukocytes, UA MODERATE (A) NEGATIVE  Urine microscopic-add on  Result Value Ref Range    Squamous Epithelial / LPF MANY (A) RARE   WBC, UA 3-6 <3 WBC/hpf   Bacteria, UA MANY (A) RARE   Urine-Other MUCOUS PRESENT     Imaging Review No results found.   EKG Interpretation None      MDM   Final diagnoses:  None    1. Hyperemesis gravidarum  The patient feels much better with fluids and IV zofran. VSS. She has close follow up with her OB. She states she is comfortable going home. Tolerating PO fluids. Stable for discharge.     Elpidio Anis, PA-C 01/15/15 2308  Gilda Crease, MD 01/15/15 [phone]

## 2015-01-15 NOTE — ED Notes (Signed)
Pt has had 14 emesis occurences today. Patient is [redacted] weeks pregnant and has had constant nausea and emesis. Has tried Zofran, Reglan, Diclegis without any alleviation of symptoms. Says she is started to feel lightheaded after all the nausea and vomiting. No other c/c. RR even/unlabored. [facility].

## 2015-01-15 NOTE — Discharge Instructions (Signed)
Hyperemesis Gravidarum °Hyperemesis gravidarum is a severe form of nausea and vomiting that happens during pregnancy. Hyperemesis is worse than morning sickness. It may cause you to have nausea or vomiting all day for many days. It may keep you from eating and drinking enough food and liquids. Hyperemesis usually occurs during the first half (the first 20 weeks) of pregnancy. It often goes away once a woman is in her second half of pregnancy. However, sometimes hyperemesis continues through an entire pregnancy.  °CAUSES  °The cause of this condition is not completely known but is thought to be related to changes in the body's hormones when pregnant. It could be from the high level of the pregnancy hormone or an increase in estrogen in the body.  °SIGNS AND SYMPTOMS  °· Severe nausea and vomiting. °· Nausea that does not go away. °· Vomiting that does not allow you to keep any food down. °· Weight loss and body fluid loss (dehydration). °· Having no desire to eat or not liking food you have previously enjoyed. °DIAGNOSIS  °Your health care provider will do a physical exam and ask you about your symptoms. He or she may also order blood tests and urine tests to make sure something else is not causing the problem.  °TREATMENT  °You may only need medicine to control the problem. If medicines do not control the nausea and vomiting, you will be treated in the hospital to prevent dehydration, increased acid in the blood (acidosis), weight loss, and changes in the electrolytes in your body that may harm the unborn baby (fetus). You may need IV fluids.  °HOME CARE INSTRUCTIONS  °· Only take over-the-counter or prescription medicines as directed by your health care provider. °· Try eating a couple of dry crackers or toast in the morning before getting out of bed. °· Avoid foods and smells that upset your stomach. °· Avoid fatty and spicy foods. °· Eat 5-6 small meals a day. °· Do not drink when eating meals. Drink between  meals. °· For snacks, eat high-protein foods, such as cheese. °· Eat or suck on things that have ginger in them. Ginger helps nausea. °· Avoid food preparation. The smell of food can spoil your appetite. °· Avoid iron pills and iron in your multivitamins until after 3-4 months of being pregnant. However, consult with your health care provider before stopping any prescribed iron pills. °SEEK MEDICAL CARE IF:  °· Your abdominal pain increases. °· You have a severe headache. °· You have vision problems. °· You are losing weight. °SEEK IMMEDIATE MEDICAL CARE IF:  °· You are unable to keep fluids down. °· You vomit blood. °· You have constant nausea and vomiting. °· You have excessive weakness. °· You have extreme thirst. °· You have dizziness or fainting. °· You have a fever or persistent symptoms for more than 2-3 days. °· You have a fever and your symptoms suddenly get worse. °MAKE SURE YOU:  °· Understand these instructions. °· Will watch your condition. °· Will get help right away if you are not doing well or get worse. °Document Released: 06/26/2005 Document Revised: 04/16/2013 Document Reviewed: 02/05/2013 °[employer]® Patient Information ©2015 [employer]. This information is not intended to replace advice given to you by your health care provider. Make sure you discuss any questions you have with your health care provider. ° °

## 2015-03-23 IMAGING — US US OB TRANSVAGINAL
1 series · 14 of 28 positions shown · non-contrast
Comparison: None.

CLINICAL DATA: Pelvic pain in first trimester pregnancy

EXAM:
OBSTETRIC <14 WK US AND TRANSVAGINAL OB US
TECHNIQUE: Both transabdominal and transvaginal ultrasound examinations were
performed for complete evaluation of the gestation as well as the
maternal uterus, adnexal regions, and pelvic cul-de-sac.
Transvaginal technique was performed to assess early pregnancy.

[Series 1: us ob transvaginal · 0.22mm/px · 74 acquisitions, 14 frames shown]
[im 3/74]
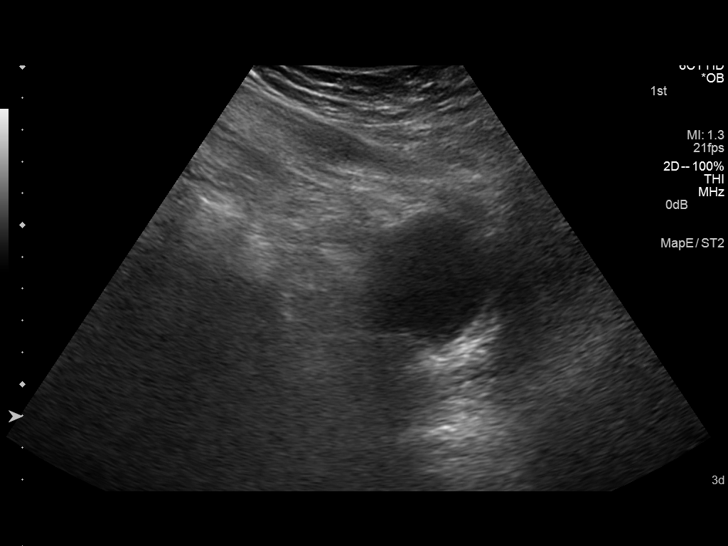
[im 9/74]
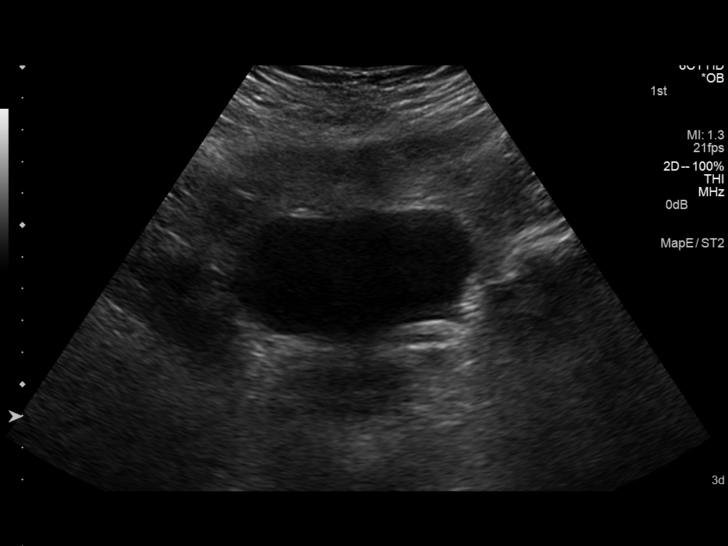
[im 14/74]
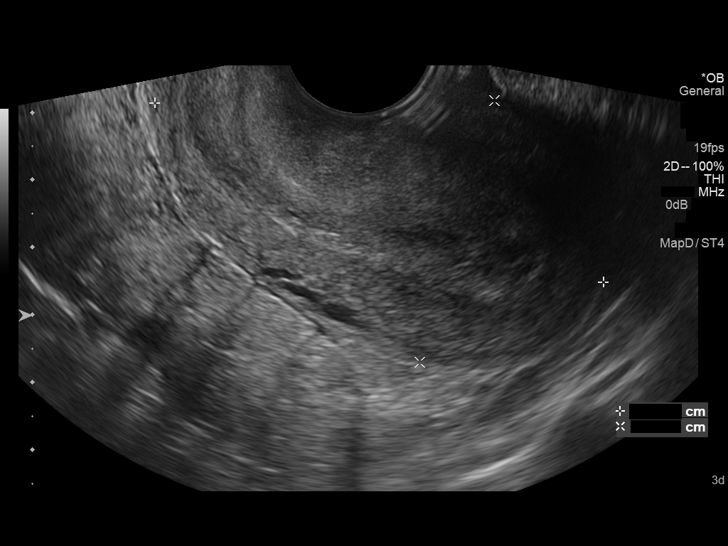
[im 19/74]
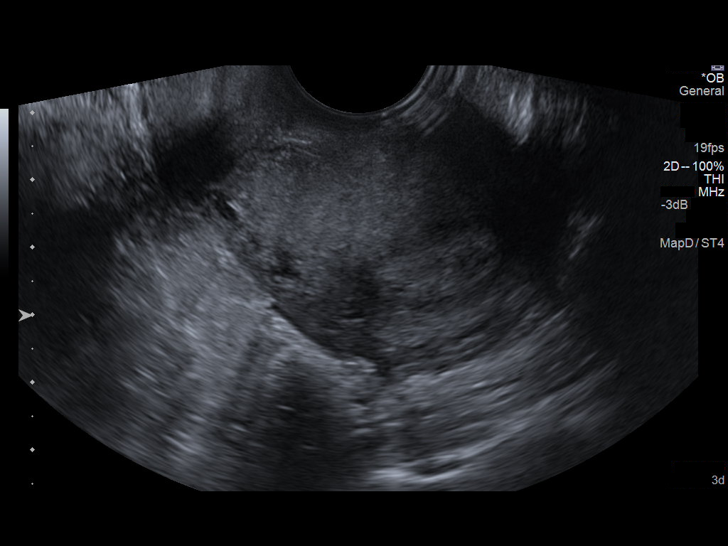
[im 25/74]
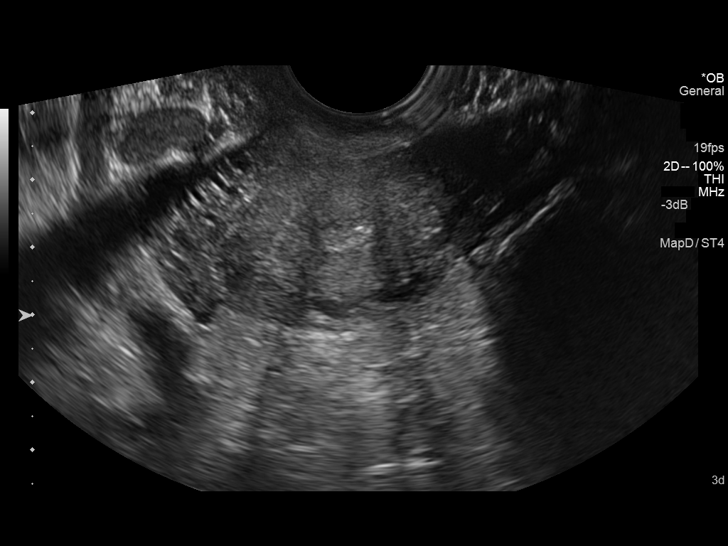
[im 30/74]
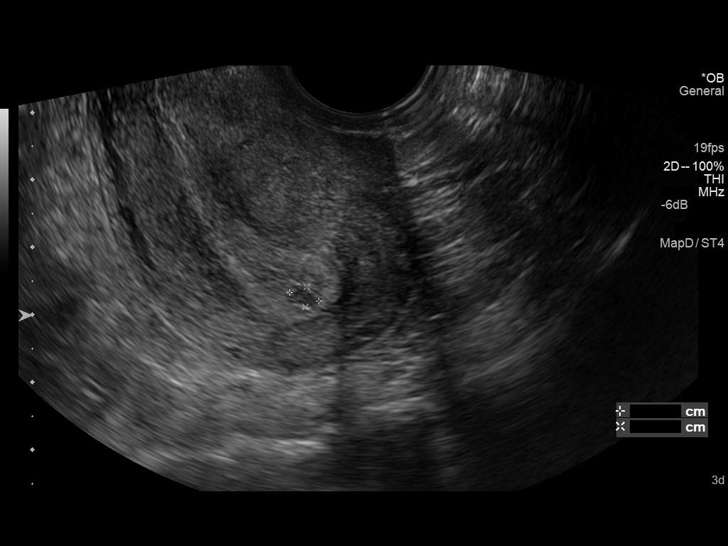
[im 36/74]
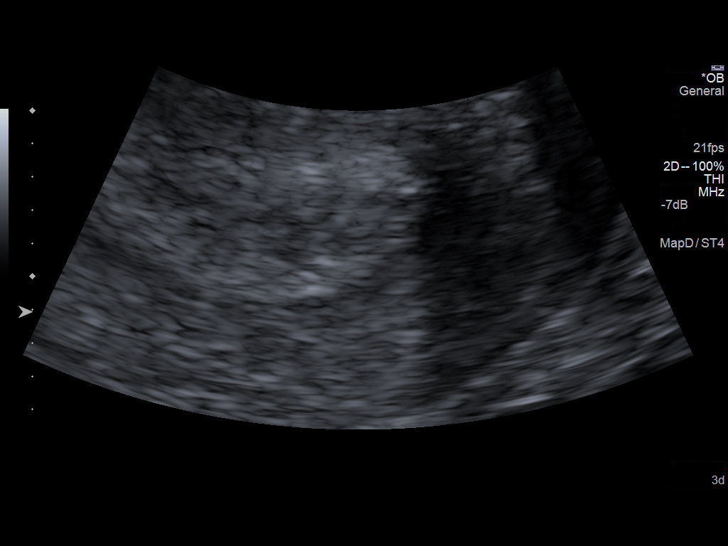
[im 41/74]
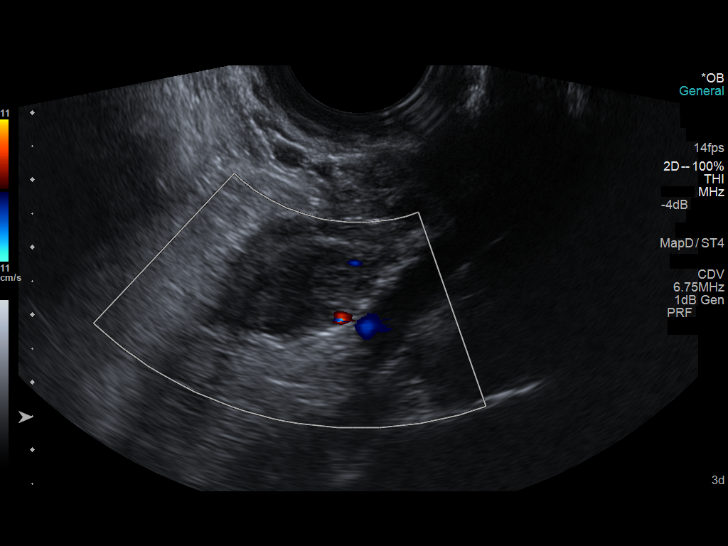
[im 46/74]
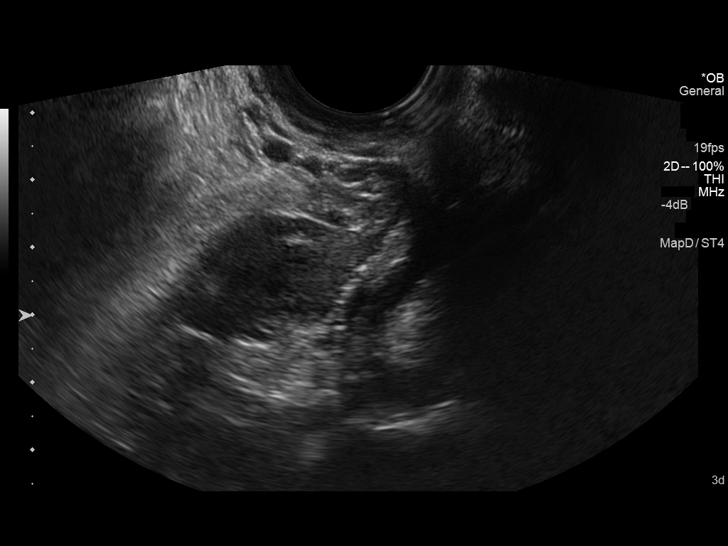
[im 52/74]
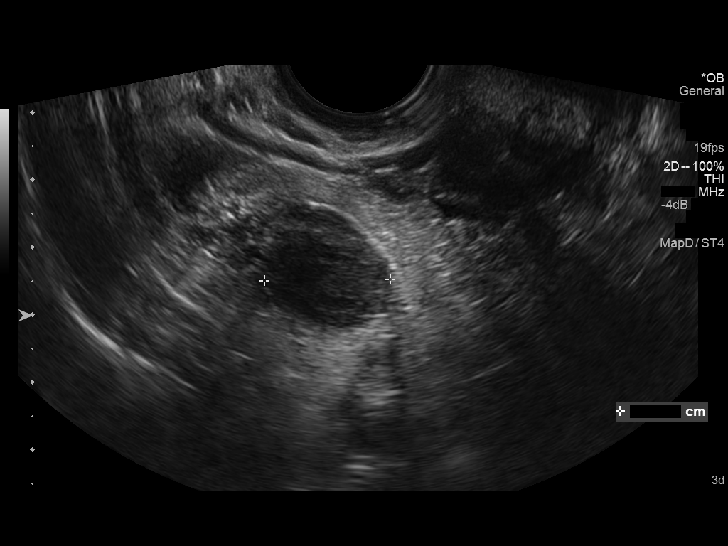
[im 57/74]
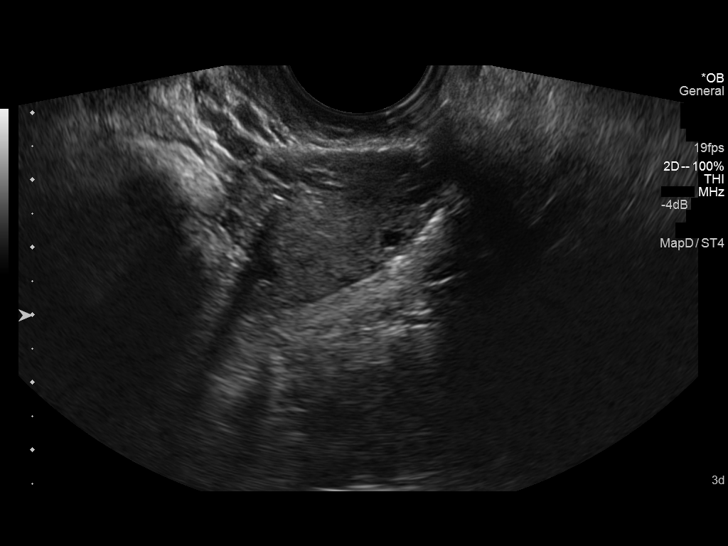
[im 63/74]
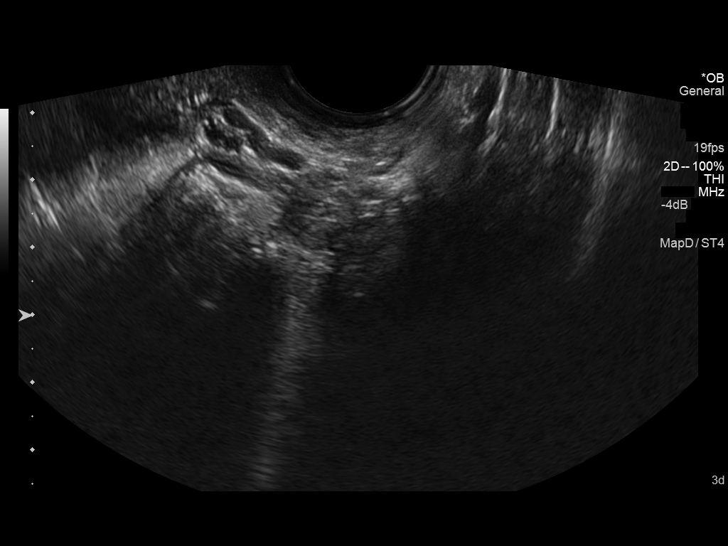
[im 68/74]
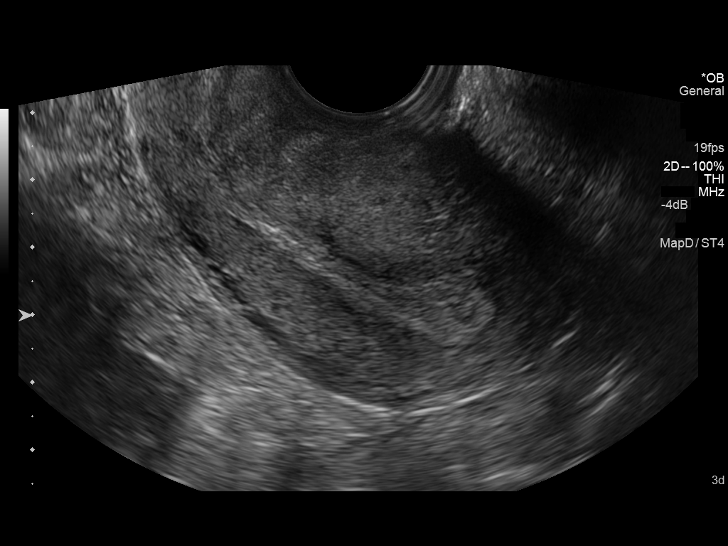
[im 74/74]
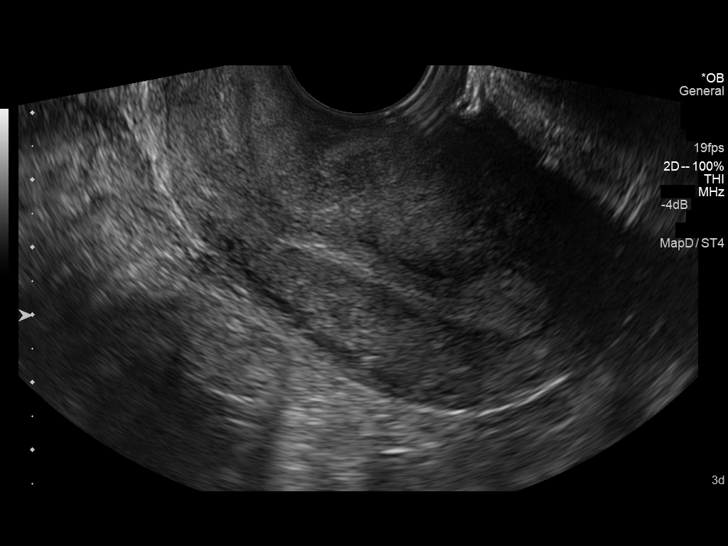

[14 of 28 positions shown; findings below may reference images not displayed]

FINDINGS: Intrauterine gestational sac: Visualized/normal in shape.

Yolk sac:  Present

Embryo:  Not yet seen

MSD: 4.5  mm   5 w   2  d            US EDC: 05/10/2015

Maternal uterus/adnexae: The ovaries are symmetric and unremarkable.
Questionable small fluid in the upper endometrial cavity, of
doubtful clinical significance. No free pelvic fluid.
IMPRESSION: Early intrauterine gestation, estimated age 5 weeks 2 days. An
embryo is not yet visible.

## 2016-05-03 ENCOUNTER — Other Ambulatory Visit: Payer: Self-pay | Admitting: Ophthalmology

## 2016-05-03 DIAGNOSIS — H4710 Unspecified papilledema: Secondary | ICD-10-CM

## 2016-05-12 ENCOUNTER — Ambulatory Visit
Admission: RE | Admit: 2016-05-12 | Discharge: 2016-05-12 | Disposition: A | Discharge status: Home / Self Care | Payer: BLUE CROSS/BLUE SHIELD | Source: Ambulatory Visit | Attending: Ophthalmology | Admitting: Ophthalmology

## 2016-05-12 DIAGNOSIS — H4710 Unspecified papilledema: Secondary | ICD-10-CM

## 2016-05-12 MED ORDER — GADOBENATE DIMEGLUMINE 529 MG/ML IV SOLN
18.0000 mL | Freq: Once | INTRAVENOUS | Status: AC | PRN
  Administered 2016-05-12: 18 mL via INTRAVENOUS
